# Patient Record
Sex: Female | Born: 1955 | Race: White | Hispanic: No | Marital: Single | Smoking: Current every day smoker
Health system: Southern US, Community
[De-identification: ages and names within clinical notes are randomized; demographics above are authoritative.]

## PROBLEM LIST (undated history)

## (undated) DIAGNOSIS — K219 Gastro-esophageal reflux disease without esophagitis: Secondary | ICD-10-CM

## (undated) DIAGNOSIS — E7800 Pure hypercholesterolemia, unspecified: Secondary | ICD-10-CM

---

## 2014-10-23 ENCOUNTER — Ambulatory Visit: Payer: No Typology Code available for payment source

## 2014-10-23 ENCOUNTER — Encounter: Payer: Self-pay | Admitting: Emergency Medicine

## 2014-10-23 ENCOUNTER — Ambulatory Visit
Admission: EM | Admit: 2014-10-23 | Discharge: 2014-10-23 | Disposition: A | Discharge status: Home / Self Care | Payer: No Typology Code available for payment source | Attending: Family Medicine | Admitting: Family Medicine

## 2014-10-23 DIAGNOSIS — E780 Pure hypercholesterolemia: Secondary | ICD-10-CM | POA: Diagnosis not present

## 2014-10-23 DIAGNOSIS — W19XXXA Unspecified fall, initial encounter: Secondary | ICD-10-CM | POA: Insufficient documentation

## 2014-10-23 DIAGNOSIS — F17210 Nicotine dependence, cigarettes, uncomplicated: Secondary | ICD-10-CM | POA: Diagnosis not present

## 2014-10-23 DIAGNOSIS — M25562 Pain in left knee: Secondary | ICD-10-CM | POA: Diagnosis present

## 2014-10-23 DIAGNOSIS — S82145A Nondisplaced bicondylar fracture of left tibia, initial encounter for closed fracture: Secondary | ICD-10-CM | POA: Diagnosis not present

## 2014-10-23 DIAGNOSIS — S82142A Displaced bicondylar fracture of left tibia, initial encounter for closed fracture: Secondary | ICD-10-CM

## 2014-10-23 DIAGNOSIS — S8392XA Sprain of unspecified site of left knee, initial encounter: Secondary | ICD-10-CM | POA: Diagnosis not present

## 2014-10-23 DIAGNOSIS — K219 Gastro-esophageal reflux disease without esophagitis: Secondary | ICD-10-CM | POA: Insufficient documentation

## 2014-10-23 HISTORY — DX: Gastro-esophageal reflux disease without esophagitis: K21.9

## 2014-10-23 HISTORY — DX: Pure hypercholesterolemia, unspecified: E78.00

## 2014-10-23 MED ORDER — HYDROCODONE-ACETAMINOPHEN 5-325 MG PO TABS: 1.0000 | ORAL_TABLET | Freq: Four times a day (QID) | ORAL | Status: AC | PRN

## 2014-10-23 MED ORDER — HYDROCODONE-ACETAMINOPHEN 5-325 MG PO TABS: ORAL_TABLET | ORAL | Status: AC

## 2014-10-23 MED ORDER — KETOROLAC TROMETHAMINE 60 MG/2ML IM SOLN
60.0000 mg | Freq: Once | INTRAMUSCULAR | Status: AC
  Administered 2014-10-23: 60 mg via INTRAMUSCULAR

## 2014-10-23 NOTE — ED Notes (Signed)
Patient states that she was coming down from the ladder and stepped down hard on her left foot and felt a pop in her left knee and left lower leg since yesterday.

## 2014-10-23 NOTE — Discharge Instructions (Signed)
Knee Sprain A knee sprain is a tear in one of the strong, fibrous tissues that connect the bones (ligaments) in your knee. The severity of the sprain depends on how much of the ligament is torn. The tear can be either partial or complete. CAUSES  Often, sprains are a result of a fall or injury. The force of the impact causes the fibers of your ligament to stretch too much. This excess tension causes the fibers of your ligament to tear. SIGNS AND SYMPTOMS  You may have some loss of motion in your knee. Other symptoms include:  Bruising.  Pain in the knee area.  Tenderness of the knee to the touch.  Swelling. DIAGNOSIS  To diagnose a knee sprain, your health care provider will physically examine your knee. Your health care provider may also suggest an X-ray exam of your knee to make sure no bones are broken. TREATMENT  If your ligament is only partially torn, treatment usually involves keeping the knee in a fixed position (immobilization) or bracing your knee for activities that require movement for several weeks. To do this, your health care provider will apply a bandage, cast, or splint to keep your knee from moving and to support your knee during movement until it heals. For a partially torn ligament, the healing process usually takes 4-6 weeks. If your ligament is completely torn, depending on which ligament it is, you may need surgery to reconnect the ligament to the bone or reconstruct it. After surgery, a cast or splint may be applied and will need to stay on your knee for 4-6 weeks while your ligament heals. HOME CARE INSTRUCTIONS  Keep your injured knee elevated to decrease swelling.  To ease pain and swelling, apply ice to the injured area:  Put ice in a plastic bag.  Place a towel between your skin and the bag.  Leave the ice on for 20 minutes, 2-3 times a day.  Only take medicine for pain as directed by your health care provider.  Do not leave your knee unprotected until  pain and stiffness go away (usually 4-6 weeks).  If you have a cast or splint, do not allow it to get wet. If you have been instructed not to remove it, cover it with a plastic bag when you shower or bathe. Do not swim.  Your health care provider may suggest exercises for you to do during your recovery to prevent or limit permanent weakness and stiffness. SEEK IMMEDIATE MEDICAL CARE IF:  Your cast or splint becomes damaged.  Your pain becomes worse.  You have significant pain, swelling, or numbness below the cast or splint. MAKE SURE YOU:  Understand these instructions.  Will watch your condition.  Will get help right away if you are not doing well or get worse. Document Released: 05/29/2005 Document Revised: 03/19/2013 Document Reviewed: 01/08/2013 [facility] Patient Information 2015 [place], [place]. This information is not intended to replace advice given to you by your health care provider. Make sure you discuss any questions you have with your health care provider. Tibial Plateau Fracture, Undisplaced, Adult You have a fracture (break in bone) of your tibial plateau. This is a fracture in the upper part of the large "shin" bone (tibia) in your lower leg. The plateau is the top of the bone that butts up against the femur (thigh bone of your upper leg). This is what makes up your knee joint. Because this fracture goes into the knee joint, it is necessary that this fracture be  fixed in the best position possible. Otherwise over the years this fracture can cause severe arthritis and marked disability. This may still occur even with the best and ideal treatment. These fractures are easily diagnosed with x-rays. TREATMENT  You have a fracture that may heal without disability and can be treated with immobilization. This means the bone can be held with a cast or splint in a favorable position until your caregiver feels it is stable enough (healed well enough) that you can begin range of motion  exercises. These will help keep your knee limber (moving well). HOME CARE INSTRUCTIONS   Apply ice to the injury for 15-20 minutes, 03-04 times per day while awake, for 2 days. Put the ice in a plastic bag and place a thin towel between the bag of ice and your cast.  If you have a plaster or fiberglass cast:  Do not try to scratch the skin under the cast using sharp or pointed objects.  Check the skin around the cast every day. You may put lotion on any red or sore areas.  Keep your cast dry and clean.  If you have a plaster splint:  Wear the splint as directed.  You may loosen the elastic around the splint if your toes become numb, tingle, or turn cold or blue.  Do not put pressure on any part of your cast or splint until it is fully hardened.  Your cast or splint can be protected during bathing with a plastic bag. Do not lower the cast or splint into water.  Use crutches as directed.  Only take over-the-counter or prescription medicines for pain, discomfort, or fever as directed by your caregiver.  See your caregiver as directed. It is very important to keep all follow-up referrals and appointments in order to avoid any long-term problems with your knee including chronic pain, inability to move the ankle normally, and permanent disability. SEEK IMMEDIATE MEDICAL CARE IF:   Pain is becoming worse rather than better, or if pain is uncontrolled with medications.  You have increased swelling or redness in the foot.  You begin to lose feeling in your foot or toes.  You develop a cold or blue foot or toes on the injured side.  You develop severe pain in your injured leg. Document Released: 03/08/2005 Document Revised: 08/21/2011 Document Reviewed: 04/13/2007 [facility] PsExitCare Patient Information 2015 [place], [place]. This information is not intended to replace advice given to you by your health care provider. Make sure you discuss any questions you have with your health care provider.

## 2014-10-23 NOTE — ED Provider Notes (Addendum)
CSN: [id number]     Arrival date & time 10/23/14  [id number] History   First MD Initiated Contact with Patient 10/23/14 [id number]     Chief Complaint  Patient presents with  . Knee Pain    left   Patient is a 59 y.o. female presenting with knee pain. The history is provided by the patient.  Knee Pain Location:  Knee Time since incident:  12 hours Injury: yes   Mechanism of injury: fall   Fall:    Fall occurred:  From a stool (states as she was stepping off some steps, she missed a step and felt a pop as she hyperextended her knee; yesterday)   Impact surface:  Hard floor   Point of impact:  Feet   Entrapped after fall: no   Knee location:  L knee   Past Medical History  Diagnosis Date  . GERD (gastroesophageal reflux disease)   . Hypercholesteremia    History reviewed. No pertinent past surgical history. History reviewed. No pertinent family history. History  Substance Use Topics  . Smoking status: Current Every Day Smoker    Types: Cigarettes  . Smokeless tobacco: Never Used  . Alcohol Use: Yes   OB History    No data available     Review of Systems  Allergies  Review of patient's allergies indicates no known allergies.  Home Medications   Prior to Admission medications   Medication Sig Start Date End Date Taking? Authorizing Provider  atorvastatin (LIPITOR) 40 MG tablet Take 40 mg by mouth daily.   Yes Historical Provider, MD  ranitidine (ZANTAC) 25 MG effervescent tablet Take 25 mg by mouth 2 (two) times daily.   Yes Historical Provider, MD  HYDROcodone-acetaminophen (NORCO/VICODIN) 5-325 MG per tablet Take 1-2 tablets by mouth every 6 (six) hours as needed. 10/23/14   Payton Mccallumrlando Valeree Leidy, MD  HYDROcodone-acetaminophen (NORCO/VICODIN) 5-325 MG per tablet 1-2 tablets po q6-8 hours prn 10/23/14   Payton Mccallumrlando Kayde Warehime, MD   BP 158/82 mmHg  Pulse 95  Temp(Src) 96.8 F (36 C) (Tympanic)  Resp 16  Ht 5\' 6"  (1.676 m)  Wt 130 lb (58.968 kg)  BMI 20.99 kg/m2  SpO2 97% Physical Exam   Constitutional: She appears well-developed and well-nourished. No distress.  Musculoskeletal: She exhibits edema.       Left knee: She exhibits decreased range of motion and swelling (mild diffuse edema). She exhibits no effusion, no ecchymosis, no deformity, no laceration, no erythema, normal alignment, no LCL laxity, normal patellar mobility, no bony tenderness and no MCL laxity. Tenderness found. Medial joint line and lateral joint line tenderness noted. No patellar tendon tenderness noted.  Neurological: She is alert.  Skin: Skin is warm and dry. No rash noted. She is not diaphoretic.  Nursing note and vitals reviewed.   ED Course  Procedures (including critical care time) Labs Review Labs Reviewed - No data to display  Imaging Review Dg Knee Complete 4 Views Left  10/23/2014   CLINICAL DATA:  Pt was walking backward down ladder yesterday and stepped off with left leg too early thinking she was on last rung. Pt felt a pop behind knee as heel hit the ground. Did not fall. Pain in post knee region and swelling medially and laterally  EXAM: LEFT KNEE - COMPLETE 4+ VIEW  COMPARISON:  None.  FINDINGS: Subtle nondisplaced, non comminuted fracture of the lateral tibial plateau. There is a minimal offset of the articular surface at the fracture line, of 1 mm.  No other  evidence of a fracture. Knee joint is normally spaced and aligned.  Moderate to large joint effusion distends the suprapatellar joint capsule.  Soft tissues are unremarkable.  IMPRESSION: Nondisplaced fracture of the lateral tibial plateau.   Electronically Signed   By: Amie Portlandavid  Ormond M.D.   On: 10/23/2014 10:12     MDM   1. Fracture, tibial plateau, left, closed, initial encounter    New Prescriptions   HYDROCODONE-ACETAMINOPHEN (NORCO/VICODIN) 5-325 MG PER TABLET    Take 1-2 tablets by mouth every 6 (six) hours as needed.   HYDROCODONE-ACETAMINOPHEN (NORCO/VICODIN) 5-325 MG PER TABLET    1-2 tablets po q6-8 hours prn    Plan: 1. Test/x-ray results (nondisplaced left lateral tibial plateau fracture) and diagnosis reviewed with patient 2. rx as per orders; risks, benefits, potential side effects reviewed with patient 3. Recommend immobilization with knee immobilizer (given/placed in clinic), crutches, non-weightbearing, and referral to orthopedics ([facility] for further evaluation and management (patient will follow up with orthopedics next week) 4. F/u prn if symptoms worsen or don't improve   Payton Mccallumrlando Camay Pedigo, MD 10/23/14 1039  Payton Mccallumrlando Dorri Ozturk, MD 10/23/14 1048   Patient given Toradol 60mg  IM during visit with improvement of pain symptom.     Payton Mccallumrlando Tennille Montelongo, MD 11/06/14 [phone]

## 2016-12-01 ENCOUNTER — Other Ambulatory Visit: Payer: Self-pay | Admitting: Family Medicine

## 2016-12-01 DIAGNOSIS — Z1231 Encounter for screening mammogram for malignant neoplasm of breast: Secondary | ICD-10-CM

## 2016-12-04 ENCOUNTER — Ambulatory Visit
Admission: RE | Admit: 2016-12-04 | Discharge: 2016-12-04 | Disposition: A | Discharge status: Home / Self Care | Payer: Managed Care, Other (non HMO) | Source: Ambulatory Visit | Attending: Family Medicine | Admitting: Family Medicine

## 2016-12-04 DIAGNOSIS — R928 Other abnormal and inconclusive findings on diagnostic imaging of breast: Secondary | ICD-10-CM | POA: Insufficient documentation

## 2016-12-04 DIAGNOSIS — Z1231 Encounter for screening mammogram for malignant neoplasm of breast: Secondary | ICD-10-CM | POA: Diagnosis present

## 2016-12-06 ENCOUNTER — Inpatient Hospital Stay
Admission: RE | Admit: 2016-12-06 | Discharge: 2016-12-06 | Disposition: A | Discharge status: Home / Self Care | Payer: Self-pay | Source: Ambulatory Visit | Attending: *Deleted | Admitting: *Deleted

## 2016-12-06 ENCOUNTER — Other Ambulatory Visit: Payer: Self-pay | Admitting: *Deleted

## 2016-12-06 DIAGNOSIS — Z9289 Personal history of other medical treatment: Secondary | ICD-10-CM

## 2016-12-08 ENCOUNTER — Other Ambulatory Visit: Payer: Self-pay | Admitting: Family Medicine

## 2016-12-08 DIAGNOSIS — N6489 Other specified disorders of breast: Secondary | ICD-10-CM

## 2016-12-08 DIAGNOSIS — R928 Other abnormal and inconclusive findings on diagnostic imaging of breast: Secondary | ICD-10-CM

## 2016-12-18 ENCOUNTER — Ambulatory Visit
Admission: RE | Admit: 2016-12-18 | Discharge: 2016-12-18 | Disposition: A | Discharge status: Home / Self Care | Payer: Managed Care, Other (non HMO) | Source: Ambulatory Visit | Attending: Family Medicine | Admitting: Family Medicine

## 2016-12-18 DIAGNOSIS — N6489 Other specified disorders of breast: Secondary | ICD-10-CM | POA: Insufficient documentation

## 2016-12-18 DIAGNOSIS — R928 Other abnormal and inconclusive findings on diagnostic imaging of breast: Secondary | ICD-10-CM

## 2017-06-26 ENCOUNTER — Other Ambulatory Visit: Payer: Self-pay | Admitting: Family Medicine

## 2017-06-26 DIAGNOSIS — R928 Other abnormal and inconclusive findings on diagnostic imaging of breast: Secondary | ICD-10-CM

## 2017-07-04 ENCOUNTER — Other Ambulatory Visit: Payer: Self-pay | Admitting: Family Medicine

## 2017-07-04 DIAGNOSIS — R928 Other abnormal and inconclusive findings on diagnostic imaging of breast: Secondary | ICD-10-CM

## 2017-08-09 ENCOUNTER — Other Ambulatory Visit: Payer: Managed Care, Other (non HMO)

## 2018-08-26 IMAGING — MG MM DIGITAL DIAGNOSTIC UNILAT*L* W/ TOMO W/ CAD
4 series · 4 of 4 positions shown · non-contrast
Comparison: Previous exam(s).

CLINICAL DATA: Left upper central breast focal asymmetry seen on
most recent screening mammography.

EXAM:
2D DIGITAL DIAGNOSTIC LEFT MAMMOGRAM WITH CAD AND ADJUNCT TOMO
ULTRASOUND LEFT BREAST

[L CC]
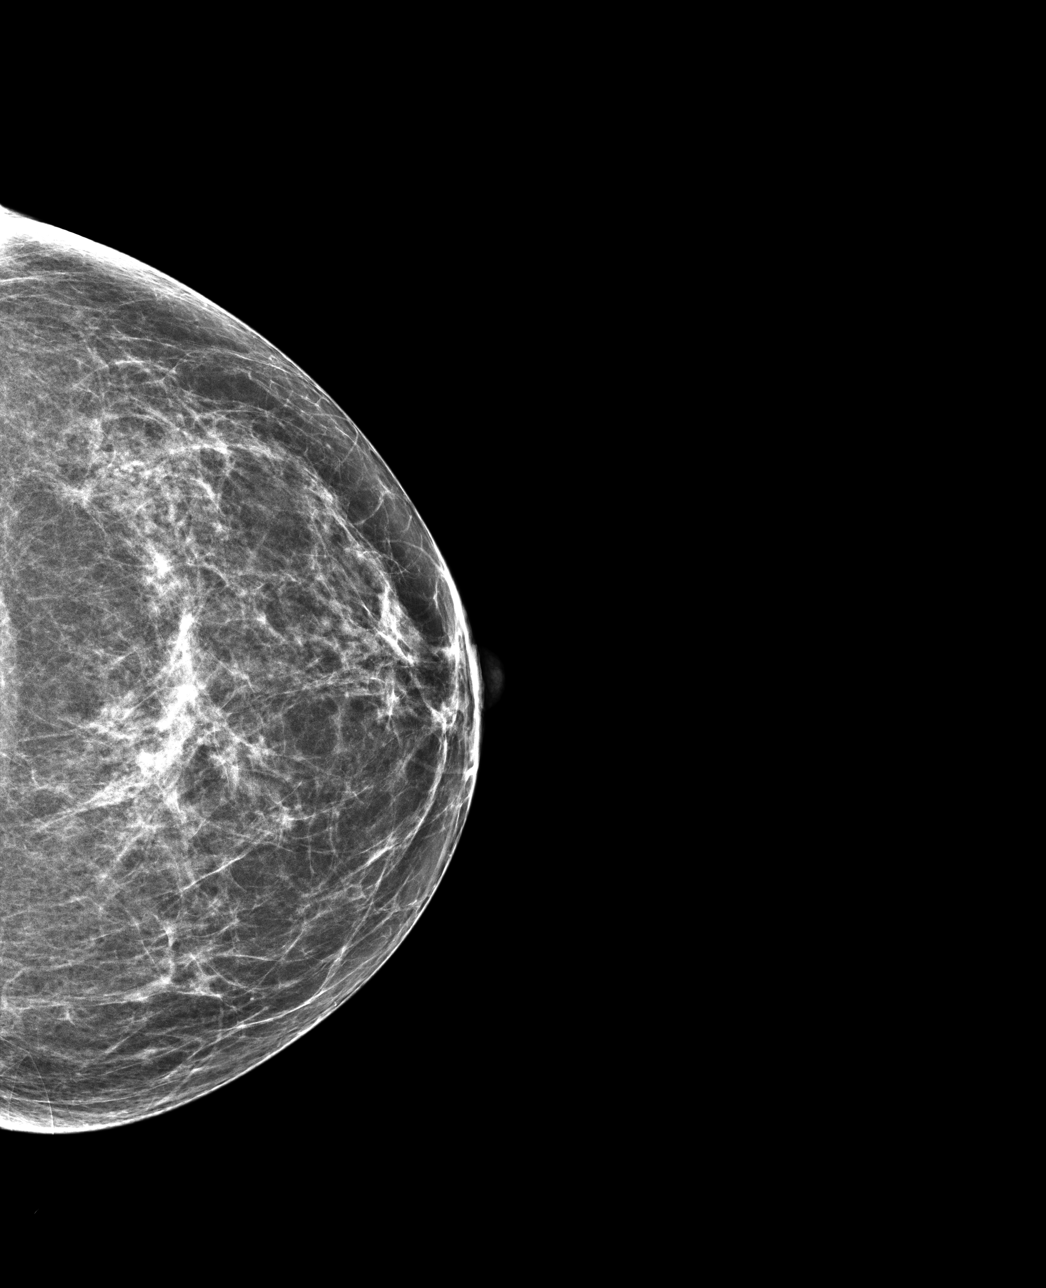

[L MLO synth-2D]
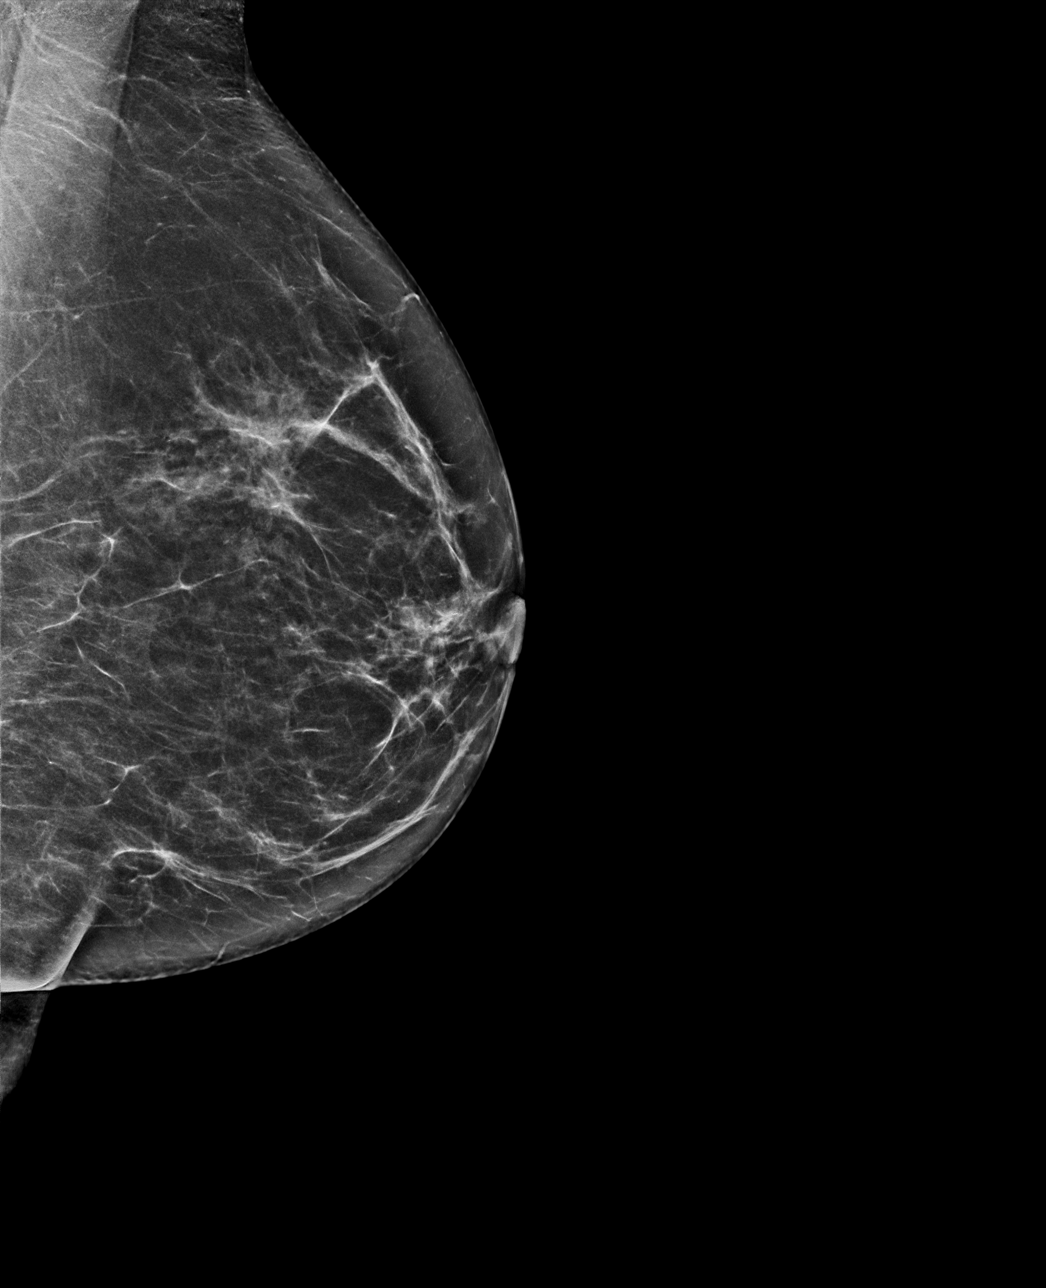

[L MLO]
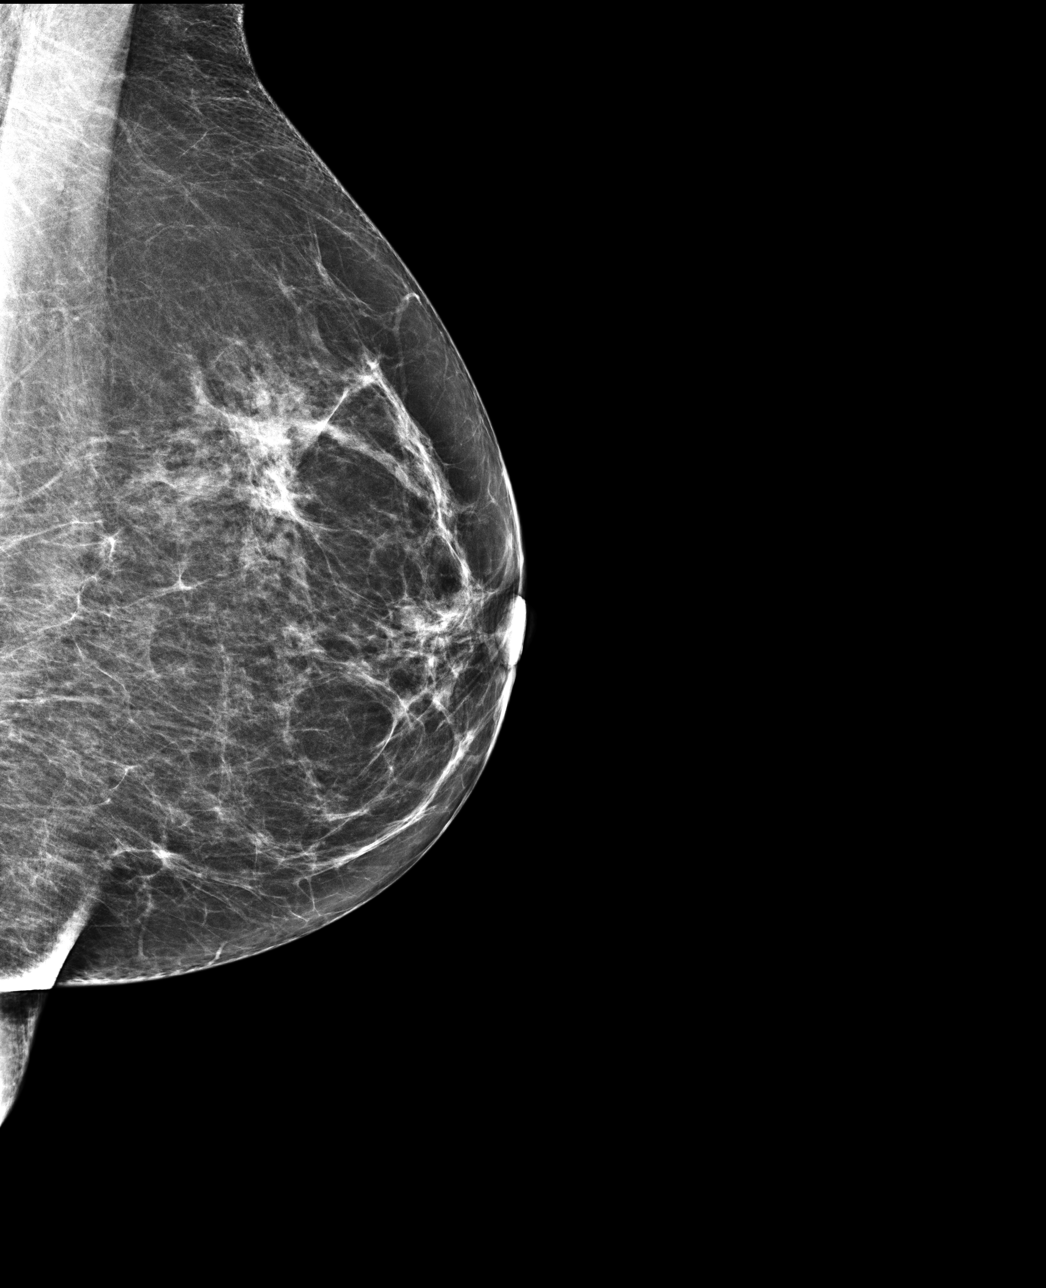

[L CC synth-2D]
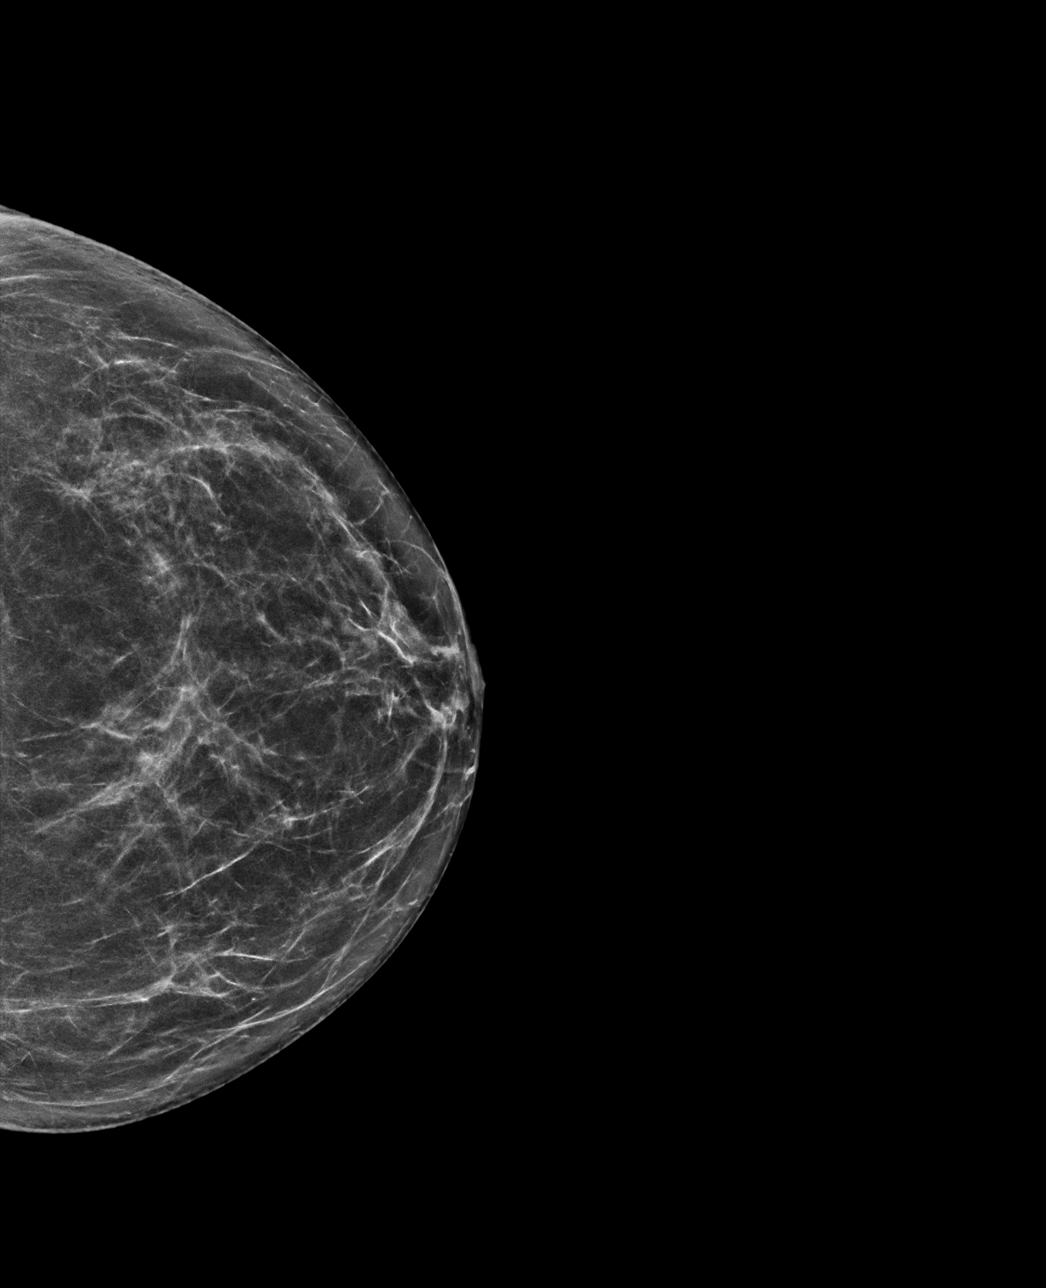

[4 of 4 positions shown; findings below may reference images not displayed]

ACR Breast Density Category b: There are scattered areas of
fibroglandular density.
FINDINGS: Additional mammographic views of the left breast demonstrate near
complete effacement of previously seen focal asymmetry in the
slightly upper central left breast, posterior depth. There is no
discrete mass or architectural distortion.

Mammographic images were processed with CAD.

On physical exam, no suspicious masses are palpated.

Targeted ultrasound is performed, showing no suspicious masses or
shadowing lesions.
IMPRESSION: Subtle non mass appearing focal asymmetry in the slightly upper
central left breast, posterior depth, without sonographic
correlation.

This finding may represent normal fibroglandular tissue, appearing
slightly asymmetric from patient's prior mammograms due to
differences in imaging techniques. The options of short term,
six-month, follow-up with mammogram and ultrasound versus breast MRI
were discussed with the patient.

RECOMMENDATION:
Breast MRI with and without contrast versus diagnostic mammogram and
possibly ultrasound of the left breast in 6 months. (Code:[id number]-[id number])

I have discussed the findings and recommendations with the patient.
Results were also provided in writing at the conclusion of the
visit. If applicable, a reminder letter will be sent to the patient
regarding the next appointment.

BI-RADS CATEGORY  3: Probably benign.
